# Patient Record
Sex: Male | Born: 2005 | Race: White | Hispanic: No | Marital: Single | Smoking: Never smoker
Health system: Southern US, Community
[De-identification: ages and names within clinical notes are randomized; demographics above are authoritative.]

## PROBLEM LIST (undated history)

## (undated) DIAGNOSIS — E039 Hypothyroidism, unspecified: Secondary | ICD-10-CM

## (undated) HISTORY — PX: NO PAST SURGERIES: SHX2092

## (undated) HISTORY — DX: Hypothyroidism, unspecified: E03.9

---
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
## 2024-01-09 ENCOUNTER — Other Ambulatory Visit (HOSPITAL_COMMUNITY): Payer: Self-pay

## 2024-01-19 ENCOUNTER — Other Ambulatory Visit (INDEPENDENT_AMBULATORY_CARE_PROVIDER_SITE_OTHER): Payer: Self-pay | Admitting: Family

## 2024-01-19 ENCOUNTER — Other Ambulatory Visit (HOSPITAL_COMMUNITY): Payer: Self-pay

## 2024-01-20 ENCOUNTER — Other Ambulatory Visit (HOSPITAL_COMMUNITY): Payer: Self-pay

## 2024-01-20 ENCOUNTER — Encounter (HOSPITAL_COMMUNITY): Payer: Self-pay

## 2024-01-20 MED ORDER — LEVOTHYROXINE SODIUM 88 MCG PO TABS
44.0000 ug | ORAL_TABLET | Freq: Every day | ORAL | 6 refills | Status: AC
  Filled 2024-01-20: qty 15, 30d supply, fill #0
  Filled 2024-02-14 (×2): qty 15, 30d supply, fill #1
  Filled 2024-03-23: qty 15, 30d supply, fill #2

## 2024-01-21 DIAGNOSIS — F411 Generalized anxiety disorder: Secondary | ICD-10-CM | POA: Diagnosis not present

## 2024-01-22 DIAGNOSIS — E1065 Type 1 diabetes mellitus with hyperglycemia: Secondary | ICD-10-CM | POA: Diagnosis not present

## 2024-01-28 DIAGNOSIS — F411 Generalized anxiety disorder: Secondary | ICD-10-CM | POA: Diagnosis not present

## 2024-02-03 ENCOUNTER — Other Ambulatory Visit (HOSPITAL_COMMUNITY): Payer: Self-pay

## 2024-02-13 DIAGNOSIS — F411 Generalized anxiety disorder: Secondary | ICD-10-CM | POA: Diagnosis not present

## 2024-02-14 ENCOUNTER — Other Ambulatory Visit (HOSPITAL_COMMUNITY): Payer: Self-pay

## 2024-02-14 ENCOUNTER — Other Ambulatory Visit (INDEPENDENT_AMBULATORY_CARE_PROVIDER_SITE_OTHER): Payer: Self-pay | Admitting: Family

## 2024-02-16 ENCOUNTER — Other Ambulatory Visit (HOSPITAL_COMMUNITY): Payer: Self-pay

## 2024-02-16 ENCOUNTER — Telehealth (INDEPENDENT_AMBULATORY_CARE_PROVIDER_SITE_OTHER): Payer: Self-pay | Admitting: Family

## 2024-02-16 DIAGNOSIS — E1065 Type 1 diabetes mellitus with hyperglycemia: Secondary | ICD-10-CM

## 2024-02-16 MED ORDER — INSULIN LISPRO 100 UNIT/ML IJ SOLN
300.0000 [IU] | INTRAMUSCULAR | 3 refills | Status: AC
  Filled 2024-02-16: qty 40, 28d supply, fill #0
  Filled 2024-03-23: qty 40, 28d supply, fill #1

## 2024-02-16 MED ORDER — DEXCOM G7 SENSOR MISC
5 refills | Status: AC
  Filled 2024-02-16 – 2024-02-27 (×5): qty 3, 30d supply, fill #0
  Filled 2024-03-23: qty 3, 30d supply, fill #1
  Filled 2024-04-20: qty 3, 30d supply, fill #2
  Filled 2024-06-02: qty 3, 30d supply, fill #3

## 2024-02-16 NOTE — Telephone Encounter (Signed)
 Called mom and let her know he needs to see his PCP to get a referral to adult endocrinologist to receive more refills. Mom verbalized understanding and stated that will not be a problem. RX sent

## 2024-02-16 NOTE — Telephone Encounter (Signed)
  Name of who is calling: Kristen   Caller's Relationship to Patient: Mom  Best contact number: [phone]  Provider they see: Jeannene  Reason for call: Mom called to get a refill on Scott [name]'s prescription.       PRESCRIPTION REFILL ONLY  Name of prescription:SENSORS  Pharmacy: [facility]

## 2024-02-17 ENCOUNTER — Other Ambulatory Visit (HOSPITAL_COMMUNITY): Payer: Self-pay

## 2024-02-18 ENCOUNTER — Other Ambulatory Visit (INDEPENDENT_AMBULATORY_CARE_PROVIDER_SITE_OTHER): Payer: Self-pay

## 2024-02-18 ENCOUNTER — Telehealth (INDEPENDENT_AMBULATORY_CARE_PROVIDER_SITE_OTHER): Payer: Self-pay

## 2024-02-18 DIAGNOSIS — M25562 Pain in left knee: Secondary | ICD-10-CM | POA: Diagnosis not present

## 2024-02-18 DIAGNOSIS — F411 Generalized anxiety disorder: Secondary | ICD-10-CM | POA: Diagnosis not present

## 2024-02-20 ENCOUNTER — Other Ambulatory Visit: Payer: Self-pay

## 2024-02-23 DIAGNOSIS — E1065 Type 1 diabetes mellitus with hyperglycemia: Secondary | ICD-10-CM | POA: Diagnosis not present

## 2024-02-24 ENCOUNTER — Other Ambulatory Visit (HOSPITAL_COMMUNITY): Payer: Self-pay

## 2024-02-25 ENCOUNTER — Other Ambulatory Visit (HOSPITAL_COMMUNITY): Payer: Self-pay

## 2024-02-25 DIAGNOSIS — F411 Generalized anxiety disorder: Secondary | ICD-10-CM | POA: Diagnosis not present

## 2024-02-27 ENCOUNTER — Other Ambulatory Visit (HOSPITAL_COMMUNITY): Payer: Self-pay

## 2024-03-01 ENCOUNTER — Other Ambulatory Visit (HOSPITAL_COMMUNITY): Payer: Self-pay

## 2024-03-03 DIAGNOSIS — F411 Generalized anxiety disorder: Secondary | ICD-10-CM | POA: Diagnosis not present

## 2024-03-10 DIAGNOSIS — F411 Generalized anxiety disorder: Secondary | ICD-10-CM | POA: Diagnosis not present

## 2024-03-23 ENCOUNTER — Other Ambulatory Visit (HOSPITAL_COMMUNITY): Payer: Self-pay

## 2024-03-23 DIAGNOSIS — F411 Generalized anxiety disorder: Secondary | ICD-10-CM | POA: Diagnosis not present

## 2024-03-24 DIAGNOSIS — E1065 Type 1 diabetes mellitus with hyperglycemia: Secondary | ICD-10-CM | POA: Diagnosis not present

## 2024-03-30 DIAGNOSIS — F411 Generalized anxiety disorder: Secondary | ICD-10-CM | POA: Diagnosis not present

## 2024-04-13 DIAGNOSIS — F411 Generalized anxiety disorder: Secondary | ICD-10-CM | POA: Diagnosis not present

## 2024-04-20 ENCOUNTER — Other Ambulatory Visit (HOSPITAL_COMMUNITY): Payer: Self-pay

## 2024-04-23 ENCOUNTER — Other Ambulatory Visit (HOSPITAL_COMMUNITY): Payer: Self-pay

## 2024-04-23 DIAGNOSIS — E063 Autoimmune thyroiditis: Secondary | ICD-10-CM | POA: Diagnosis not present

## 2024-04-23 DIAGNOSIS — E1065 Type 1 diabetes mellitus with hyperglycemia: Secondary | ICD-10-CM | POA: Diagnosis not present

## 2024-04-23 DIAGNOSIS — Z4681 Encounter for fitting and adjustment of insulin pump: Secondary | ICD-10-CM | POA: Diagnosis not present

## 2024-04-23 MED ORDER — DEXCOM G7 SENSOR MISC
1.0000 | 5 refills | Status: AC
  Filled 2024-04-23 – 2024-06-28 (×2): qty 3, 30d supply, fill #0

## 2024-04-23 MED ORDER — INSULIN GLARGINE SOLOSTAR 100 UNIT/ML ~~LOC~~ SOPN
50.0000 [IU] | PEN_INJECTOR | Freq: Every day | SUBCUTANEOUS | 3 refills | Status: DC
  Filled 2024-04-23: qty 15, 30d supply, fill #0

## 2024-04-23 MED ORDER — INSULIN LISPRO (1 UNIT DIAL) 100 UNIT/ML (KWIKPEN)
50.0000 [IU] | PEN_INJECTOR | Freq: Every day | SUBCUTANEOUS | 3 refills | Status: AC
  Filled 2024-04-23: qty 15, 30d supply, fill #0

## 2024-04-23 MED ORDER — INSULIN LISPRO 100 UNIT/ML IJ SOLN
300.0000 [IU] | INTRAMUSCULAR | 3 refills | Status: AC
  Filled 2024-04-23 – 2024-05-19 (×2): qty 40, 28d supply, fill #0
  Filled 2024-06-02 – 2024-06-28 (×2): qty 40, 28d supply, fill #1

## 2024-04-23 MED ORDER — INSULIN LISPRO 100 UNIT/ML IJ SOLN
INTRAMUSCULAR | 3 refills | Status: AC
  Filled 2024-04-23: qty 40, 28d supply, fill #0

## 2024-04-23 MED ORDER — LEVOTHYROXINE SODIUM 88 MCG PO TABS
44.0000 ug | ORAL_TABLET | Freq: Every morning | ORAL | 1 refills | Status: AC
  Filled 2024-04-23: qty 45, 90d supply, fill #0

## 2024-04-27 DIAGNOSIS — F411 Generalized anxiety disorder: Secondary | ICD-10-CM | POA: Diagnosis not present

## 2024-05-11 DIAGNOSIS — F411 Generalized anxiety disorder: Secondary | ICD-10-CM | POA: Diagnosis not present

## 2024-05-19 ENCOUNTER — Other Ambulatory Visit (HOSPITAL_COMMUNITY): Payer: Self-pay

## 2024-05-31 DIAGNOSIS — F411 Generalized anxiety disorder: Secondary | ICD-10-CM | POA: Diagnosis not present

## 2024-06-02 ENCOUNTER — Other Ambulatory Visit: Payer: Self-pay

## 2024-06-02 ENCOUNTER — Other Ambulatory Visit (HOSPITAL_COMMUNITY): Payer: Self-pay

## 2024-06-15 DIAGNOSIS — F411 Generalized anxiety disorder: Secondary | ICD-10-CM | POA: Diagnosis not present

## 2024-06-28 ENCOUNTER — Other Ambulatory Visit (HOSPITAL_COMMUNITY): Payer: Self-pay

## 2024-07-20 DIAGNOSIS — F411 Generalized anxiety disorder: Secondary | ICD-10-CM | POA: Diagnosis not present

## 2024-07-27 ENCOUNTER — Other Ambulatory Visit: Payer: Self-pay

## 2024-07-27 ENCOUNTER — Other Ambulatory Visit (HOSPITAL_COMMUNITY): Payer: Self-pay

## 2024-07-27 MED ORDER — DEXCOM G7 SENSOR MISC
1.0000 | Freq: Every day | 5 refills | Status: DC
  Filled 2024-07-27 – 2024-08-20 (×3): qty 3, 30d supply, fill #0

## 2024-07-27 MED ORDER — INSULIN LISPRO 100 UNIT/ML IJ SOLN
150.0000 [IU] | Freq: Every day | INTRAMUSCULAR | 3 refills | Status: DC
  Filled 2024-07-27 – 2024-08-19 (×2): qty 40, 28d supply, fill #0

## 2024-08-02 ENCOUNTER — Encounter (HOSPITAL_COMMUNITY): Payer: Self-pay | Admitting: Pharmacist

## 2024-08-02 ENCOUNTER — Other Ambulatory Visit (HOSPITAL_COMMUNITY): Payer: Self-pay

## 2024-08-02 MED ORDER — LEVOTHYROXINE SODIUM 88 MCG PO TABS
44.0000 ug | ORAL_TABLET | Freq: Every morning | ORAL | 1 refills | Status: AC
  Filled 2024-08-02: qty 45, 90d supply, fill #0

## 2024-08-04 ENCOUNTER — Other Ambulatory Visit (HOSPITAL_COMMUNITY): Payer: Self-pay

## 2024-08-05 ENCOUNTER — Other Ambulatory Visit (HOSPITAL_COMMUNITY): Payer: Self-pay

## 2024-08-19 ENCOUNTER — Other Ambulatory Visit (HOSPITAL_COMMUNITY): Payer: Self-pay

## 2024-08-19 ENCOUNTER — Other Ambulatory Visit: Payer: Self-pay

## 2024-08-20 ENCOUNTER — Other Ambulatory Visit (HOSPITAL_COMMUNITY): Payer: Self-pay

## 2024-08-20 ENCOUNTER — Encounter (HOSPITAL_COMMUNITY): Payer: Self-pay

## 2024-08-30 ENCOUNTER — Other Ambulatory Visit (HOSPITAL_COMMUNITY): Payer: Self-pay

## 2024-08-30 MED ORDER — INSULIN LISPRO 100 UNIT/ML IJ SOLN
150.0000 [IU] | Freq: Every day | INTRAMUSCULAR | 3 refills | Status: AC
  Filled 2024-08-30: qty 40, 28d supply, fill #0

## 2024-08-30 MED ORDER — INSULIN GLARGINE SOLOSTAR 100 UNIT/ML ~~LOC~~ SOPN
50.0000 [IU] | PEN_INJECTOR | Freq: Every day | SUBCUTANEOUS | 3 refills | Status: AC
  Filled 2024-08-30: qty 15, 30d supply, fill #0

## 2024-08-30 MED ORDER — DEXCOM G7 SENSOR MISC
1.0000 | Freq: Every day | 5 refills | Status: AC
  Filled 2024-08-30: qty 3, 30d supply, fill #0

## 2024-09-02 ENCOUNTER — Other Ambulatory Visit: Payer: Self-pay

## 2024-09-02 ENCOUNTER — Other Ambulatory Visit (HOSPITAL_COMMUNITY): Payer: Self-pay

## 2024-09-02 MED ORDER — INSULIN LISPRO 100 UNIT/ML IJ SOLN: 150.0000 [IU] | Freq: Every day | INTRAMUSCULAR | 3 refills | Status: AC

## 2024-09-02 MED ORDER — INSULIN LISPRO (1 UNIT DIAL) 100 UNIT/ML (KWIKPEN)
50.0000 [IU] | PEN_INJECTOR | Freq: Every day | SUBCUTANEOUS | 3 refills | Status: AC
  Filled 2024-09-02: qty 45, 90d supply, fill #0

## 2024-09-02 MED ORDER — DEXCOM G7 SENSOR MISC: 3 refills | Status: AC

## 2024-09-02 MED ORDER — LANTUS SOLOSTAR 100 UNIT/ML ~~LOC~~ SOPN
50.0000 [IU] | PEN_INJECTOR | Freq: Every day | SUBCUTANEOUS | 3 refills | Status: AC
  Filled 2024-09-02: qty 45, 90d supply, fill #0
